# Patient Record
Sex: Female | Born: 1970 | Race: White | Hispanic: No | Marital: Single | Smoking: Former smoker
Health system: Southern US, Community
[De-identification: ages and names within clinical notes are randomized; demographics above are authoritative.]

## PROBLEM LIST (undated history)

## (undated) DIAGNOSIS — K219 Gastro-esophageal reflux disease without esophagitis: Secondary | ICD-10-CM

## (undated) DIAGNOSIS — F329 Major depressive disorder, single episode, unspecified: Secondary | ICD-10-CM

## (undated) DIAGNOSIS — G43909 Migraine, unspecified, not intractable, without status migrainosus: Secondary | ICD-10-CM

## (undated) DIAGNOSIS — F32A Depression, unspecified: Secondary | ICD-10-CM

## (undated) HISTORY — DX: Gastro-esophageal reflux disease without esophagitis: K21.9

## (undated) HISTORY — DX: Depression, unspecified: F32.A

## (undated) HISTORY — DX: Major depressive disorder, single episode, unspecified: F32.9

## (undated) HISTORY — DX: Migraine, unspecified, not intractable, without status migrainosus: G43.909

---

## 2001-04-27 HISTORY — PX: APPENDECTOMY: SHX54

## 2010-01-05 ENCOUNTER — Emergency Department (HOSPITAL_COMMUNITY): Admission: EM | Admit: 2010-01-05 | Discharge: 2010-01-05 | Payer: Self-pay | Admitting: Emergency Medicine

## 2011-10-23 IMAGING — CR DG FOOT COMPLETE 3+V*L*
3 series · 3 of 3 positions shown · non-contrast
Comparison: None.

CLINICAL DATA: Left foot pain secondary to a twisting injury on
01/04/2010.  Bruising to the top of the foot.

LEFT FOOT - COMPLETE 3+ VIEW

[t foot ap left]
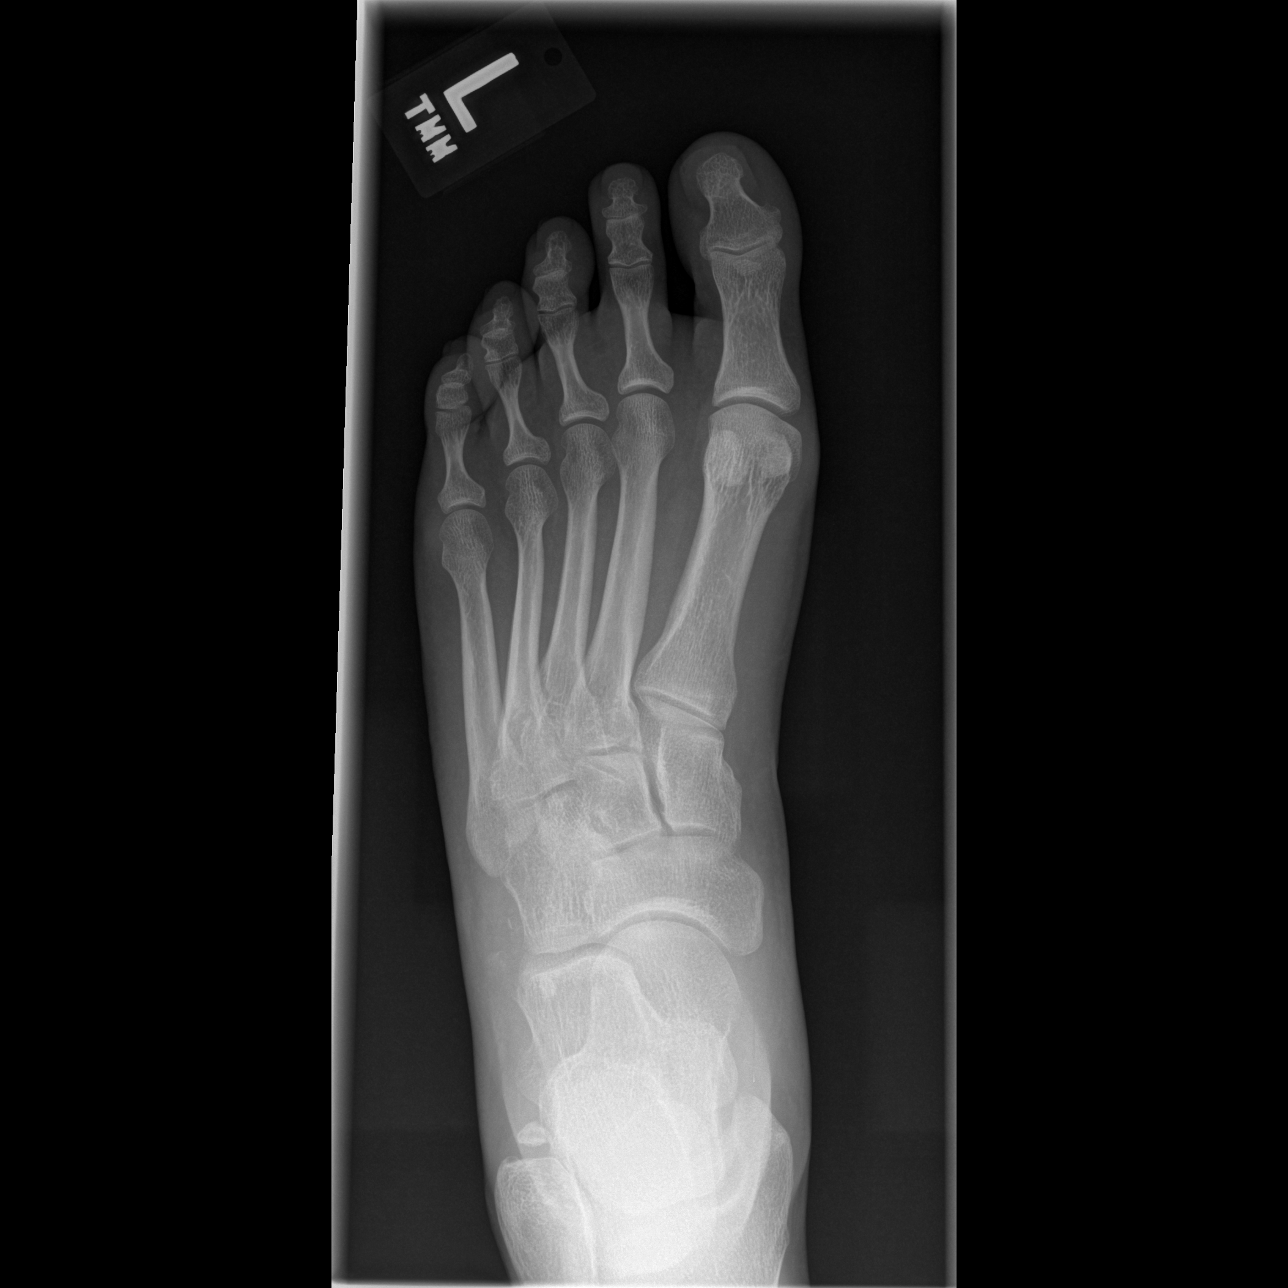

[t foot oblique left]
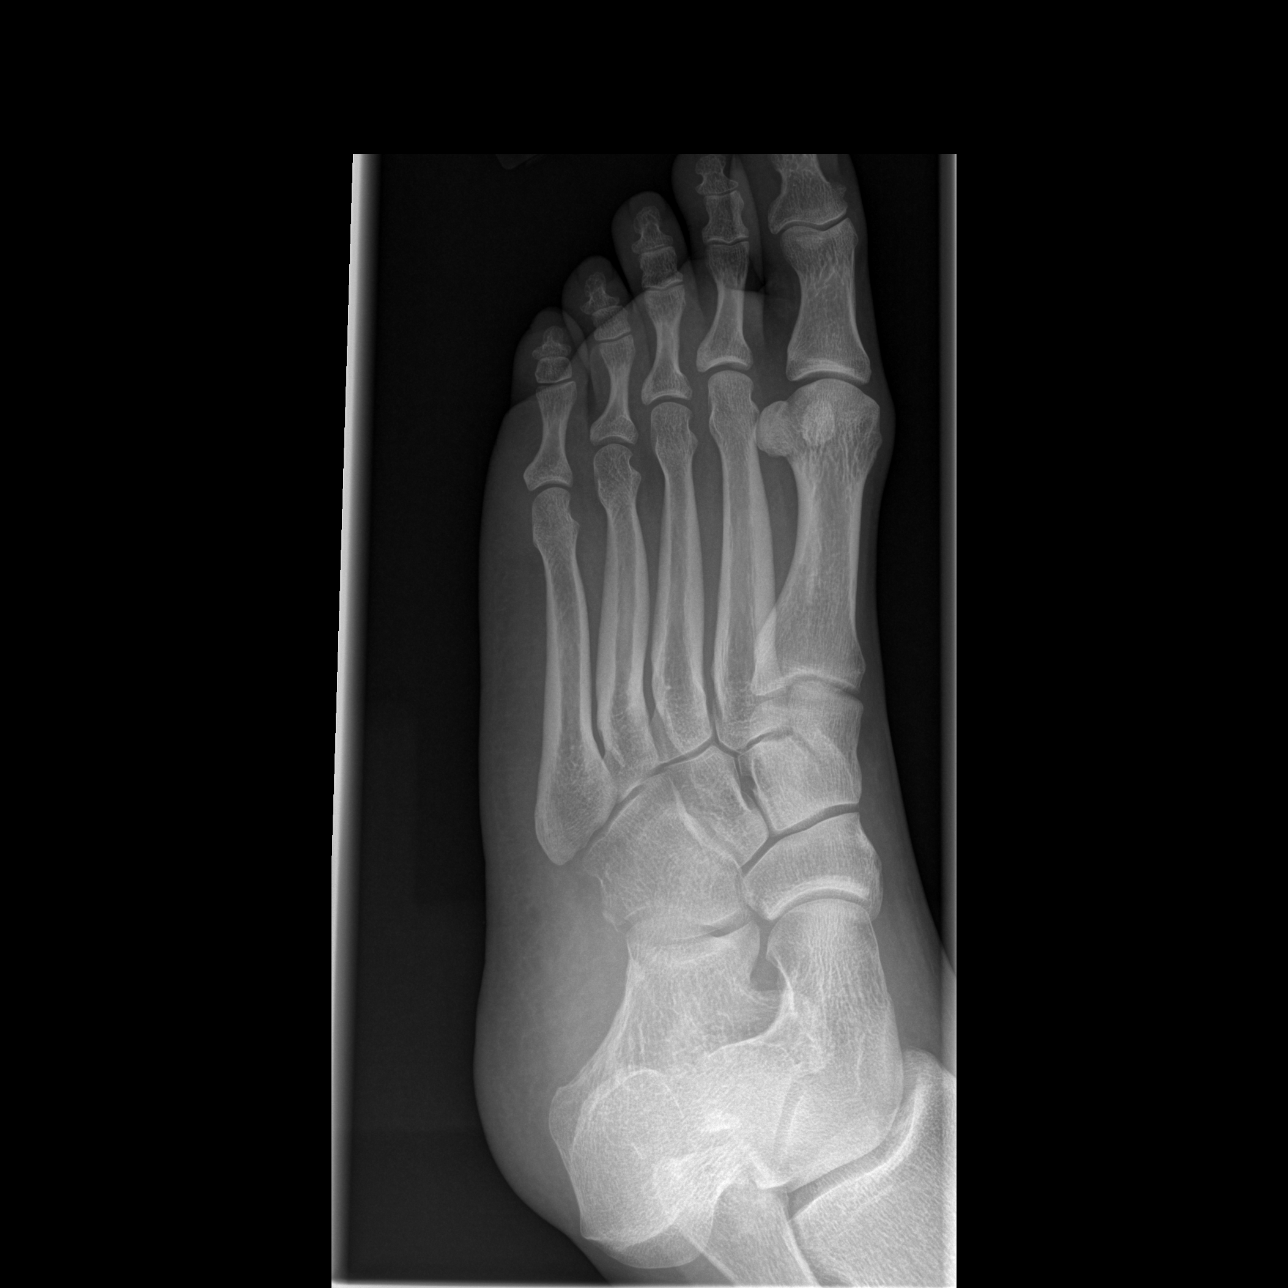

[t foot lat left]
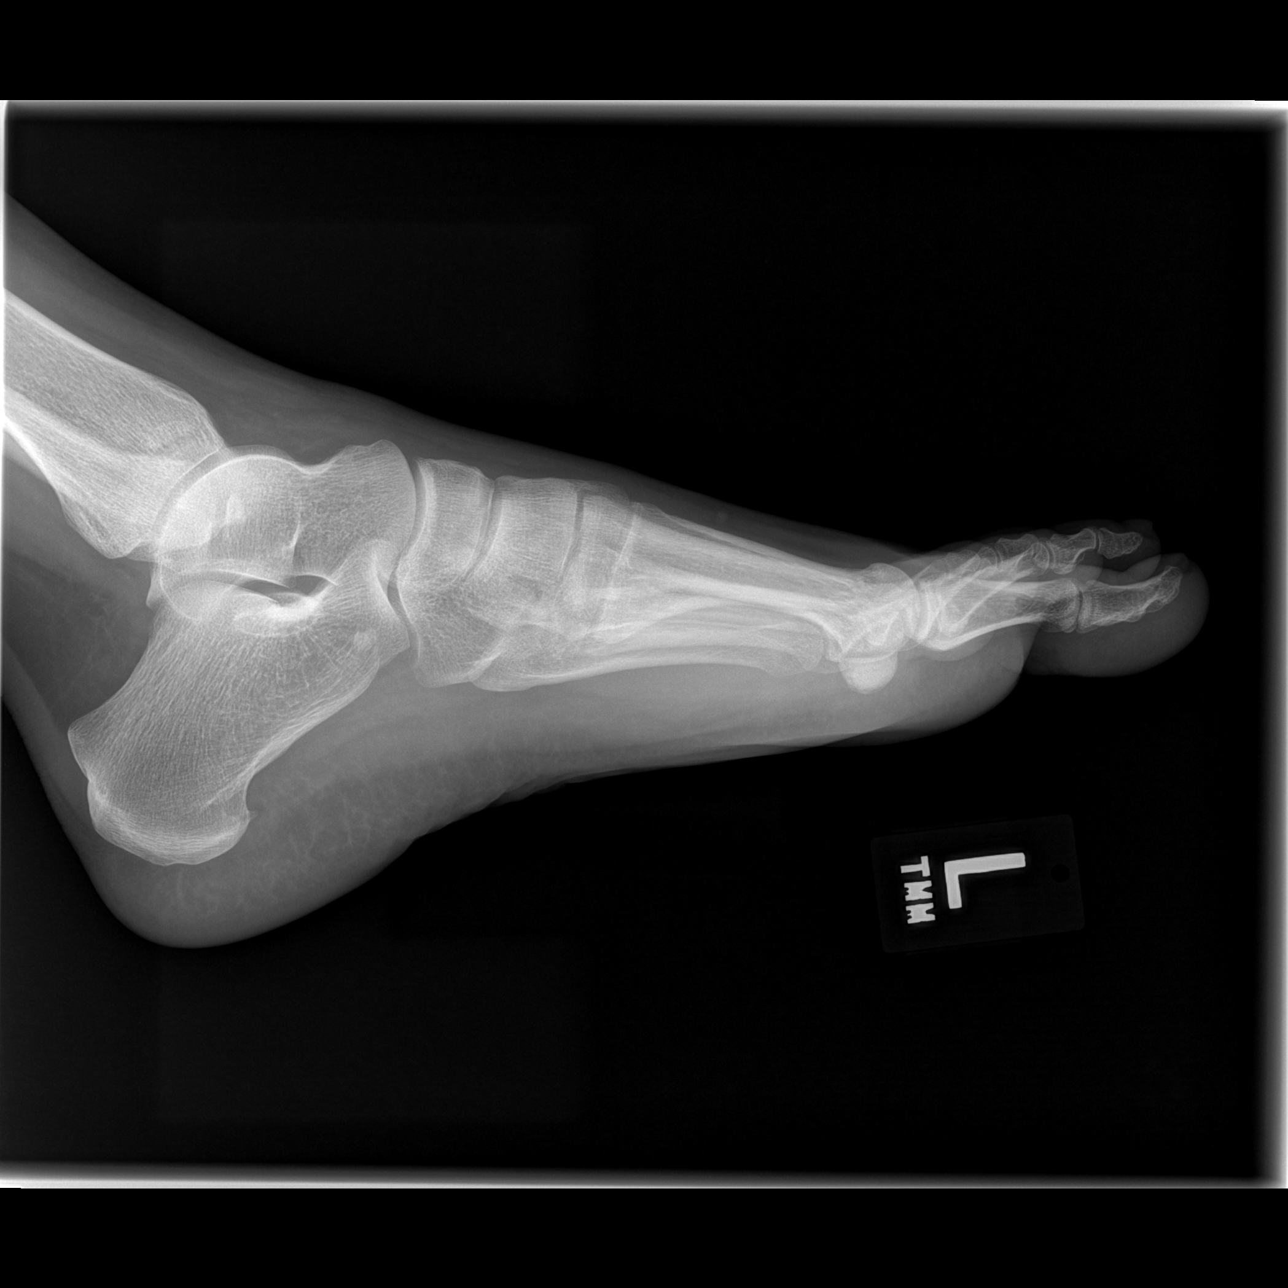

[3 of 3 positions shown; findings below may reference images not displayed]

FINDINGS: There is a small avulsion of bone from the distal lateral
aspect of the calcaneus toward the dorsum of the foot.   There is
an accessory ossification center of the tip of the fibula versus an
old avulsion.

There may be another small avulsion from the posterior lateral
aspect of the cuboid.
IMPRESSION: Small avulsions from the lateral aspect of the distal calcaneus and
possibly from the lateral aspect of the proximal cuboid.

## 2014-03-26 LAB — HM PAP SMEAR: HM Pap smear: POSITIVE

## 2014-05-03 LAB — HM MAMMOGRAPHY

## 2014-06-27 ENCOUNTER — Emergency Department: Payer: Self-pay | Admitting: Emergency Medicine

## 2014-07-03 ENCOUNTER — Emergency Department: Payer: Self-pay | Admitting: Emergency Medicine

## 2015-01-31 ENCOUNTER — Ambulatory Visit: Payer: Self-pay | Admitting: Internal Medicine

## 2015-02-05 ENCOUNTER — Ambulatory Visit (INDEPENDENT_AMBULATORY_CARE_PROVIDER_SITE_OTHER): Payer: No Typology Code available for payment source | Admitting: Internal Medicine

## 2015-02-05 ENCOUNTER — Encounter: Payer: Self-pay | Admitting: Internal Medicine

## 2015-02-05 VITALS — BP 118/84 | HR 74 | Temp 98.3°F | Ht 66.0 in | Wt 191.0 lb

## 2015-02-05 DIAGNOSIS — F32A Depression, unspecified: Secondary | ICD-10-CM

## 2015-02-05 DIAGNOSIS — R519 Headache, unspecified: Secondary | ICD-10-CM | POA: Insufficient documentation

## 2015-02-05 DIAGNOSIS — F329 Major depressive disorder, single episode, unspecified: Secondary | ICD-10-CM | POA: Diagnosis not present

## 2015-02-05 DIAGNOSIS — K219 Gastro-esophageal reflux disease without esophagitis: Secondary | ICD-10-CM | POA: Diagnosis not present

## 2015-02-05 DIAGNOSIS — R51 Headache: Secondary | ICD-10-CM | POA: Diagnosis not present

## 2015-02-05 DIAGNOSIS — Z23 Encounter for immunization: Secondary | ICD-10-CM

## 2015-02-05 DIAGNOSIS — G2581 Restless legs syndrome: Secondary | ICD-10-CM | POA: Insufficient documentation

## 2015-02-05 MED ORDER — PRAMIPEXOLE DIHYDROCHLORIDE 0.25 MG PO TABS: 0.2500 mg | ORAL_TABLET | Freq: Every day | ORAL | Status: DC

## 2015-02-05 MED ORDER — SERTRALINE HCL 100 MG PO TABS: 100.0000 mg | ORAL_TABLET | Freq: Every day | ORAL | Status: DC

## 2015-02-05 NOTE — Progress Notes (Signed)
Pre visit review using our clinic review tool, if applicable. No additional management support is needed unless otherwise documented below in the visit note. 

## 2015-02-05 NOTE — Assessment & Plan Note (Signed)
Chronic but stable on Zoloft Support offered today

## 2015-02-05 NOTE — Addendum Note (Signed)
Addended by: Roena Malady on: 02/05/2015 09:54 AM   Modules accepted: Orders

## 2015-02-05 NOTE — Assessment & Plan Note (Signed)
?   Tension Discussed how using Advil daily could be causing her GERD Discussed stress relieving techniques

## 2015-02-05 NOTE — Assessment & Plan Note (Signed)
Discussed how weight loss could help improve her reflux Discussed diet modification Advised her to continue Zantac as needed

## 2015-02-05 NOTE — Assessment & Plan Note (Signed)
Discussed treatment options eRx for Mirapex at [facility]  She will let me know via mychart how she is doing in 3 weeks

## 2015-02-05 NOTE — Patient Instructions (Signed)
Restless Legs Syndrome Restless legs syndrome is a condition that causes uncomfortable feelings or sensations in the legs, especially while sitting or lying down. The sensations usually cause an overwhelming urge to move the legs. The arms can also sometimes be affected. The condition can range from mild to severe. The symptoms often interfere with a person's ability to sleep. CAUSES The cause of this condition is not known. RISK FACTORS This condition is more likely to develop in:  People who are older than age 50.  Pregnant women. In general, restless legs syndrome is more common in women than in men.  People who have a family history of the condition.  People who have certain medical conditions, such as iron deficiency, kidney disease, Parkinson disease, or nerve damage.  People who take certain medicines, such as medicines for high blood pressure, nausea, colds, allergies, depression, and some heart conditions. SYMPTOMS The main symptom of this condition is uncomfortable sensations in the legs. These sensations may be:  Described as pulling, tingling, prickling, throbbing, crawling, or burning.  Worse while you are sitting or lying down.  Worse during periods of rest or inactivity.  Worse at night, often interfering with your sleep.  Accompanied by a very strong urge to move your legs.  Temporarily relieved by movement of your legs. The sensations usually affect both sides of the body. The arms can also be affected, but this is rare. People who have this condition often have tiredness during the day because of their lack of sleep at night. DIAGNOSIS This condition may be diagnosed based on your description of the symptoms. You may also have tests, including blood tests, to check for other conditions that may lead to your symptoms. In some cases, you may be asked to spend some time in a sleep lab so your sleeping can be monitored. TREATMENT Treatment for this condition is  focused on managing the symptoms. Treatment may include:  Self-help and lifestyle changes.  Medicines. HOME CARE INSTRUCTIONS  Take medicines only as directed by your health care provider.  Try these methods to get temporary relief from the uncomfortable sensations:  Massage your legs.  Walk or stretch.  Take a cold or hot bath.  Practice good sleep habits. For example, go to bed and get up at the same time every day.  Exercise regularly.  Practice ways of relaxing, such as yoga or meditation.  Avoid caffeine and alcohol.  Do not use any tobacco products, including cigarettes, chewing tobacco, or electronic cigarettes. If you need help quitting, ask your health care provider.  Keep all follow-up visits as directed by your health care provider. This is important. SEEK MEDICAL CARE IF: Your symptoms do not improve with treatment, or they get worse.   This information is not intended to replace advice given to you by your health care provider. Make sure you discuss any questions you have with your health care provider.   Document Released: 04/03/2002 Document Revised: 08/28/2014 Document Reviewed: 04/09/2014 Elsevier Interactive Patient Education 2016 Elsevier Inc.  

## 2015-02-05 NOTE — Progress Notes (Signed)
HPI  Pt presents to the clinic today to establish care and for management of the conditions listed below. She has not had a PCP in a while but she has been seeing GYN.  Depression: Chronic but stable on Zoloft. She denies anxiety, SI/HI.  GERD: She thinks it is related to weight gain. Occurs about 3 x week. Triggered by beer, and certain foods. She takes Zantac as needed with good relief.  Migraines: She reports she has a headaches every day. Located in the temples. She denies sensitivity to light and sound. She denies nausea or vomiting. She takes Advil as needed with good relief.  She is concerned about pain in her legs. This started about 1 month ago. It does seem worse at night. She describes the pain as a crawling sensation. She feels like she has to move her legs. It impairs her ability to fall asleep. It does not appear her ability to walk.  Flu: 01/2014 Tetanus: close to 10 years ago Pap Smear: 05/2014, abnormal, repeat pap this month Mammogram: 05/2014 Vision Screening: annually Dentist: annually  Past Medical History  Diagnosis Date  . Depression   . GERD (gastroesophageal reflux disease)   . Migraines     Current Outpatient Prescriptions  Medication Sig Dispense Refill  . ibuprofen (ADVIL,MOTRIN) 100 MG chewable tablet Chew 200 mg by mouth as needed.    . Multiple Vitamin (MULTIVITAMIN) tablet Take 1 tablet by mouth daily.    . ranitidine (ZANTAC) 150 MG tablet Take 150 mg by mouth as needed for heartburn.    . sertraline (ZOLOFT) 100 MG tablet Take 100 mg by mouth daily.     No current facility-administered medications for this visit.    No Known Allergies  Family History  Problem Relation Age of Onset  . Heart disease Mother   . Hypertension Mother   . Depression Mother   . Anxiety disorder Mother   . Stroke Sister   . Hypertension Sister   . Hyperlipidemia Maternal Grandmother   . Heart disease Maternal Grandmother   . Hypertension Maternal Grandmother   .  Hyperlipidemia Maternal Grandfather   . Heart disease Maternal Grandfather   . Hypertension Maternal Grandfather   . Depression Maternal Grandfather     Social History   Social History  . Marital Status: Single    Spouse Name: N/A  . Number of Children: N/A  . Years of Education: N/A   Occupational History  . Not on file.   Social History Main Topics  . Smoking status: Former Games developer  . Smokeless tobacco: Never Used     Comment: quit 2001  . Alcohol Use: 0.0 oz/week    0 Standard drinks or equivalent per week     Comment: social  . Drug Use: No  . Sexual Activity: Not on file   Other Topics Concern  . Not on file   Social History Narrative  . No narrative on file    ROS:  Constitutional: Denies fever, malaise, fatigue, headache or abrupt weight changes.  HEENT: Denies eye pain, eye redness, ear pain, ringing in the ears, wax buildup, runny nose, nasal congestion, bloody nose, or sore throat. Respiratory: Denies difficulty breathing, shortness of breath, cough or sputum production.   Cardiovascular: Denies chest pain, chest tightness, palpitations or swelling in the hands or feet.  Gastrointestinal: Pt reports reflux. Denies abdominal pain, bloating, constipation, diarrhea or blood in the stool.  GU: Denies frequency, urgency, pain with urination, blood in urine, odor or discharge.  Musculoskeletal: Pt reports pain in legs. Denies decrease in range of motion, difficulty with gait, muscle pain or joint pain and swelling.  Skin: Denies redness, rashes, lesions or ulcercations.  Neurological: Denies dizziness, difficulty with memory, difficulty with speech or problems with balance and coordination.  Psych: Pt reports history of depression. Denies anxiety, SI/HI.  No other specific complaints in a complete review of systems (except as listed in HPI above).  PE:  BP 118/84 mmHg  Pulse 74  Temp(Src) 98.3 F (36.8 C) (Oral)  Ht  (1.676 m)  Wt 191 lb (86.637 kg)   BMI 30.84 kg/m2  SpO2 98% Wt Readings from Last 3 Encounters:  02/05/15 191 lb (86.637 kg)    General: Appears her stated age, obese in NAD. Cardiovascular: Normal rate and rhythm. S1,S2 noted.  No murmur, rubs or gallops noted.  Pulmonary/Chest: Normal effort and positive vesicular breath sounds. No respiratory distress. No wheezes, rales or ronchi noted.  Abdomen: Soft and nontender. Normal bowel sounds.  Musculoskeletal: No calf swelling. No difficulty with gait.  Neurological: Alert and oriented. Sensation intact to BLE. Psychiatric: Mood and affect normal. Behavior is normal. Judgment and thought content normal.    Assessment and Plan:  Flu and Tdap today  Make an appt for your annual exam

## 2015-05-22 ENCOUNTER — Encounter: Payer: Self-pay | Admitting: Internal Medicine

## 2015-11-19 ENCOUNTER — Other Ambulatory Visit: Payer: Self-pay

## 2015-11-19 DIAGNOSIS — F329 Major depressive disorder, single episode, unspecified: Secondary | ICD-10-CM

## 2015-11-19 DIAGNOSIS — F32A Depression, unspecified: Secondary | ICD-10-CM

## 2015-11-19 NOTE — Telephone Encounter (Signed)
Pt left v/m requesting status of refill request submitted by [facility] [address]. Pt last seen 02/05/15. Last refilled #90 x 1 on 02/05/15.

## 2015-11-20 MED ORDER — SERTRALINE HCL 100 MG PO TABS: 100.0000 mg | ORAL_TABLET | Freq: Every day | ORAL | 1 refills | Status: DC

## 2015-11-20 NOTE — Telephone Encounter (Signed)
Sent electronically 

## 2015-12-23 ENCOUNTER — Encounter: Payer: Self-pay | Admitting: Internal Medicine

## 2015-12-23 ENCOUNTER — Ambulatory Visit (INDEPENDENT_AMBULATORY_CARE_PROVIDER_SITE_OTHER): Payer: Medicaid Other | Admitting: Internal Medicine

## 2015-12-23 VITALS — BP 120/82 | HR 68 | Temp 98.7°F | Wt 208.0 lb

## 2015-12-23 DIAGNOSIS — G2581 Restless legs syndrome: Secondary | ICD-10-CM

## 2015-12-23 DIAGNOSIS — R51 Headache: Secondary | ICD-10-CM

## 2015-12-23 DIAGNOSIS — L309 Dermatitis, unspecified: Secondary | ICD-10-CM

## 2015-12-23 DIAGNOSIS — F329 Major depressive disorder, single episode, unspecified: Secondary | ICD-10-CM

## 2015-12-23 DIAGNOSIS — R519 Headache, unspecified: Secondary | ICD-10-CM

## 2015-12-23 DIAGNOSIS — K219 Gastro-esophageal reflux disease without esophagitis: Secondary | ICD-10-CM | POA: Diagnosis not present

## 2015-12-23 DIAGNOSIS — F32A Depression, unspecified: Secondary | ICD-10-CM

## 2015-12-23 MED ORDER — SERTRALINE HCL 100 MG PO TABS: 100.0000 mg | ORAL_TABLET | Freq: Every day | ORAL | 3 refills | Status: AC

## 2015-12-23 MED ORDER — PRAMIPEXOLE DIHYDROCHLORIDE 0.25 MG PO TABS: 0.5000 mg | ORAL_TABLET | Freq: Every day | ORAL | 3 refills | Status: AC

## 2015-12-23 MED ORDER — ALIGN PO CAPS: 1.0000 | ORAL_CAPSULE | Freq: Every day | ORAL | 3 refills | Status: AC

## 2015-12-23 MED ORDER — TRIAMCINOLONE ACETONIDE 0.1 % EX CREA: 1.0000 "application " | TOPICAL_CREAM | Freq: Two times a day (BID) | CUTANEOUS | 0 refills | Status: AC

## 2015-12-23 MED ORDER — PRAMIPEXOLE DIHYDROCHLORIDE 0.25 MG PO TABS: 0.2500 mg | ORAL_TABLET | Freq: Every day | ORAL | 3 refills | Status: DC

## 2015-12-23 MED ORDER — PANTOPRAZOLE SODIUM 40 MG PO TBEC: 40.0000 mg | DELAYED_RELEASE_TABLET | Freq: Every day | ORAL | 3 refills | Status: AC

## 2015-12-23 NOTE — Progress Notes (Signed)
HPI  Pt presents to the clinic today for follow up of chronic conditions.  Depression: Chronic but stable on Zoloft. She denies anxiety, SI/HI.  GERD: She thinks it is related to weight gain. Occurs about 3 x week. Triggered by beer, and certain foods. She takes Zantac or Prilosec OTC and reports it is not helping as good as before.  Migraines: She reports she has a headaches every day. Located in the temples. She denies sensitivity to light and sound. She denies nausea or vomiting. She takes Advil as needed with good relief.  Restless legs: This started about 1 year ago. It does seem worse at night. She describes the pain as a crawling sensation. She feels like she has to move her legs. It impairs her ability to fall asleep. It does not appear her ability to walk. She is taking Mirapex as needed with good relief.   Past Medical History:  Diagnosis Date  . Depression   . GERD (gastroesophageal reflux disease)   . Migraines     Current Outpatient Prescriptions  Medication Sig Dispense Refill  . ibuprofen (ADVIL,MOTRIN) 100 MG chewable tablet Chew 200 mg by mouth as needed.    . Multiple Vitamin (MULTIVITAMIN) tablet Take 1 tablet by mouth daily.    . pramipexole (MIRAPEX) 0.25 MG tablet Take 1 tablet (0.25 mg total) by mouth at bedtime. 30 tablet 1  . ranitidine (ZANTAC) 150 MG tablet Take 150 mg by mouth as needed for heartburn.    . sertraline (ZOLOFT) 100 MG tablet Take 1 tablet (100 mg total) by mouth daily. 90 tablet 1   No current facility-administered medications for this visit.     No Known Allergies  Family History  Problem Relation Age of Onset  . Heart disease Mother   . Hypertension Mother   . Depression Mother   . Anxiety disorder Mother   . Stroke Sister   . Hypertension Sister   . Hyperlipidemia Maternal Grandmother   . Heart disease Maternal Grandmother   . Hypertension Maternal Grandmother   . Hyperlipidemia Maternal Grandfather   . Heart disease Maternal  Grandfather   . Hypertension Maternal Grandfather   . Depression Maternal Grandfather   . Cancer Neg Hx     Social History   Social History  . Marital status: Single    Spouse name: N/A  . Number of children: N/A  . Years of education: N/A   Occupational History  . Not on file.   Social History Main Topics  . Smoking status: Former Games developer  . Smokeless tobacco: Never Used     Comment: quit 2001  . Alcohol use 0.0 oz/week     Comment: social  . Drug use: No  . Sexual activity: Yes    Birth control/ protection: IUD   Other Topics Concern  . Not on file   Social History Narrative  . No narrative on file    ROS:  Constitutional: Denies fever, malaise, fatigue, headache or abrupt weight changes.  HEENT: Denies eye pain, eye redness, ear pain, ringing in the ears, wax buildup, runny nose, nasal congestion, bloody nose, or sore throat. Respiratory: Denies difficulty breathing, shortness of breath, cough or sputum production.   Cardiovascular: Denies chest pain, chest tightness, palpitations or swelling in the hands or feet.  Gastrointestinal: Pt reports reflux. Denies abdominal pain, bloating, constipation, diarrhea or blood in the stool.  GU: Denies frequency, urgency, pain with urination, blood in urine, odor or discharge. Musculoskeletal:  Denies decrease in range  of motion, difficulty with gait, muscle pain or joint pain and swelling.  Skin:  Pt reports rash on arms. Denies redness, lesions or ulcercations.  Neurological: Denies dizziness, difficulty with memory, difficulty with speech or problems with balance and coordination.  Psych: Pt reports history of depression. Denies anxiety, SI/HI.  No other specific complaints in a complete review of systems (except as listed in HPI above).  PE:  BP 120/82   Pulse 68   Temp 98.7 F (37.1 C) (Oral)   Wt 208 lb (94.3 kg)   SpO2 99%   BMI 33.57 kg/m   Wt Readings from Last 3 Encounters:  02/05/15 191 lb (86.6 kg)     General: Appears her stated age, obese in NAD. Skin: Maculopapular rash noted on bilateral antecubital fossas. Cardiovascular: Normal rate and rhythm. S1,S2 noted.  No murmur, rubs or gallops noted.  Pulmonary/Chest: Normal effort and positive vesicular breath sounds. No respiratory distress. No wheezes, rales or ronchi noted.  Abdomen: Soft and nontender. Hypoactive bowel sounds. No distention or masses noted. Musculoskeletal: No calf swelling. No difficulty with gait.  Neurological: Alert and oriented. Sensation intact to BLE. Psychiatric: Mood and affect normal. Behavior is normal. Judgment and thought content normal.    Assessment and Plan:  Eczema:  eRx for Triamcinolone cream BID to affected area Use a moisturizing lotion daily  RTC in 1 year for your annual exam

## 2015-12-23 NOTE — Assessment & Plan Note (Signed)
Try switching to Tylenol Ibuprofen may be contributing to worsening reflux Will monitor

## 2015-12-23 NOTE — Patient Instructions (Signed)
Food Choices for Gastroesophageal Reflux Disease, Adult When you have gastroesophageal reflux disease (GERD), the foods you eat and your eating habits are very important. Choosing the right foods can help ease the discomfort of GERD. WHAT GENERAL GUIDELINES DO I NEED TO FOLLOW?  Choose fruits, vegetables, whole grains, low-fat dairy products, and low-fat meat, fish, and poultry.  Limit fats such as oils, salad dressings, butter, nuts, and avocado.  Keep a food diary to identify foods that cause symptoms.  Avoid foods that cause reflux. These may be different for different people.  Eat frequent small meals instead of three large meals each day.  Eat your meals slowly, in a relaxed setting.  Limit fried foods.  Cook foods using methods other than frying.  Avoid drinking alcohol.  Avoid drinking large amounts of liquids with your meals.  Avoid bending over or lying down until 2-3 hours after eating. WHAT FOODS ARE NOT RECOMMENDED? The following are some foods and drinks that may worsen your symptoms: Vegetables Tomatoes. Tomato juice. Tomato and spaghetti sauce. Chili peppers. Onion and garlic. Horseradish. Fruits Oranges, grapefruit, and lemon (fruit and juice). Meats High-fat meats, fish, and poultry. This includes hot dogs, ribs, ham, sausage, salami, and bacon. Dairy Whole milk and chocolate milk. Sour cream. Cream. Butter. Ice cream. Cream cheese.  Beverages Coffee and tea, with or without caffeine. Carbonated beverages or energy drinks. Condiments Hot sauce. Barbecue sauce.  Sweets/Desserts Chocolate and cocoa. Donuts. Peppermint and spearmint. Fats and Oils High-fat foods, including French fries and potato chips. Other Vinegar. Strong spices, such as black pepper, white pepper, red pepper, cayenne, curry powder, cloves, ginger, and chili powder. The items listed above may not be a complete list of foods and beverages to avoid. Contact your dietitian for more  information.   This information is not intended to replace advice given to you by your health care provider. Make sure you discuss any questions you have with your health care provider.   Document Released: 04/13/2005 Document Revised: 05/04/2014 Document Reviewed: 02/15/2013 Elsevier Interactive Patient Education 2016 Elsevier Inc.  

## 2015-12-23 NOTE — Assessment & Plan Note (Signed)
Continue Mirapex prn

## 2015-12-23 NOTE — Assessment & Plan Note (Signed)
Deteriorated Encouraged weight loss and avoid foods that trigger your reflux Stop Prilosec eRx for Protonix 40 mg daily

## 2015-12-23 NOTE — Assessment & Plan Note (Signed)
Stable on Zoloft Refilled today 

## 2015-12-24 ENCOUNTER — Telehealth: Payer: Self-pay

## 2015-12-24 NOTE — Telephone Encounter (Signed)
Called pharmacy and Pharmacist stated that the Probiotic is not covered by insurance as it is available OTC-- I will call pt to let her know

## 2015-12-24 NOTE — Telephone Encounter (Signed)
It was sent and receipt was confirmed by pharmacy

## 2015-12-24 NOTE — Telephone Encounter (Signed)
Pt left v/m; pt was seen 12/23/15 and pt [facility] [address]. Pt wants to know if she is to have a prescription probiotic or should pt get probiotic OTC> pt request cb.

## 2016-04-13 IMAGING — CR DG CHEST 2V
1 series · 2 of 2 positions shown · non-contrast
Comparison: None.

CLINICAL DATA: Cough, fever for 6 days.

EXAM:
CHEST  2 VIEW

[Series 1: dxr chest pa (or ap) and lateral · 0.14mm/px · 2 of 2 slices shown]
[im 1/2]
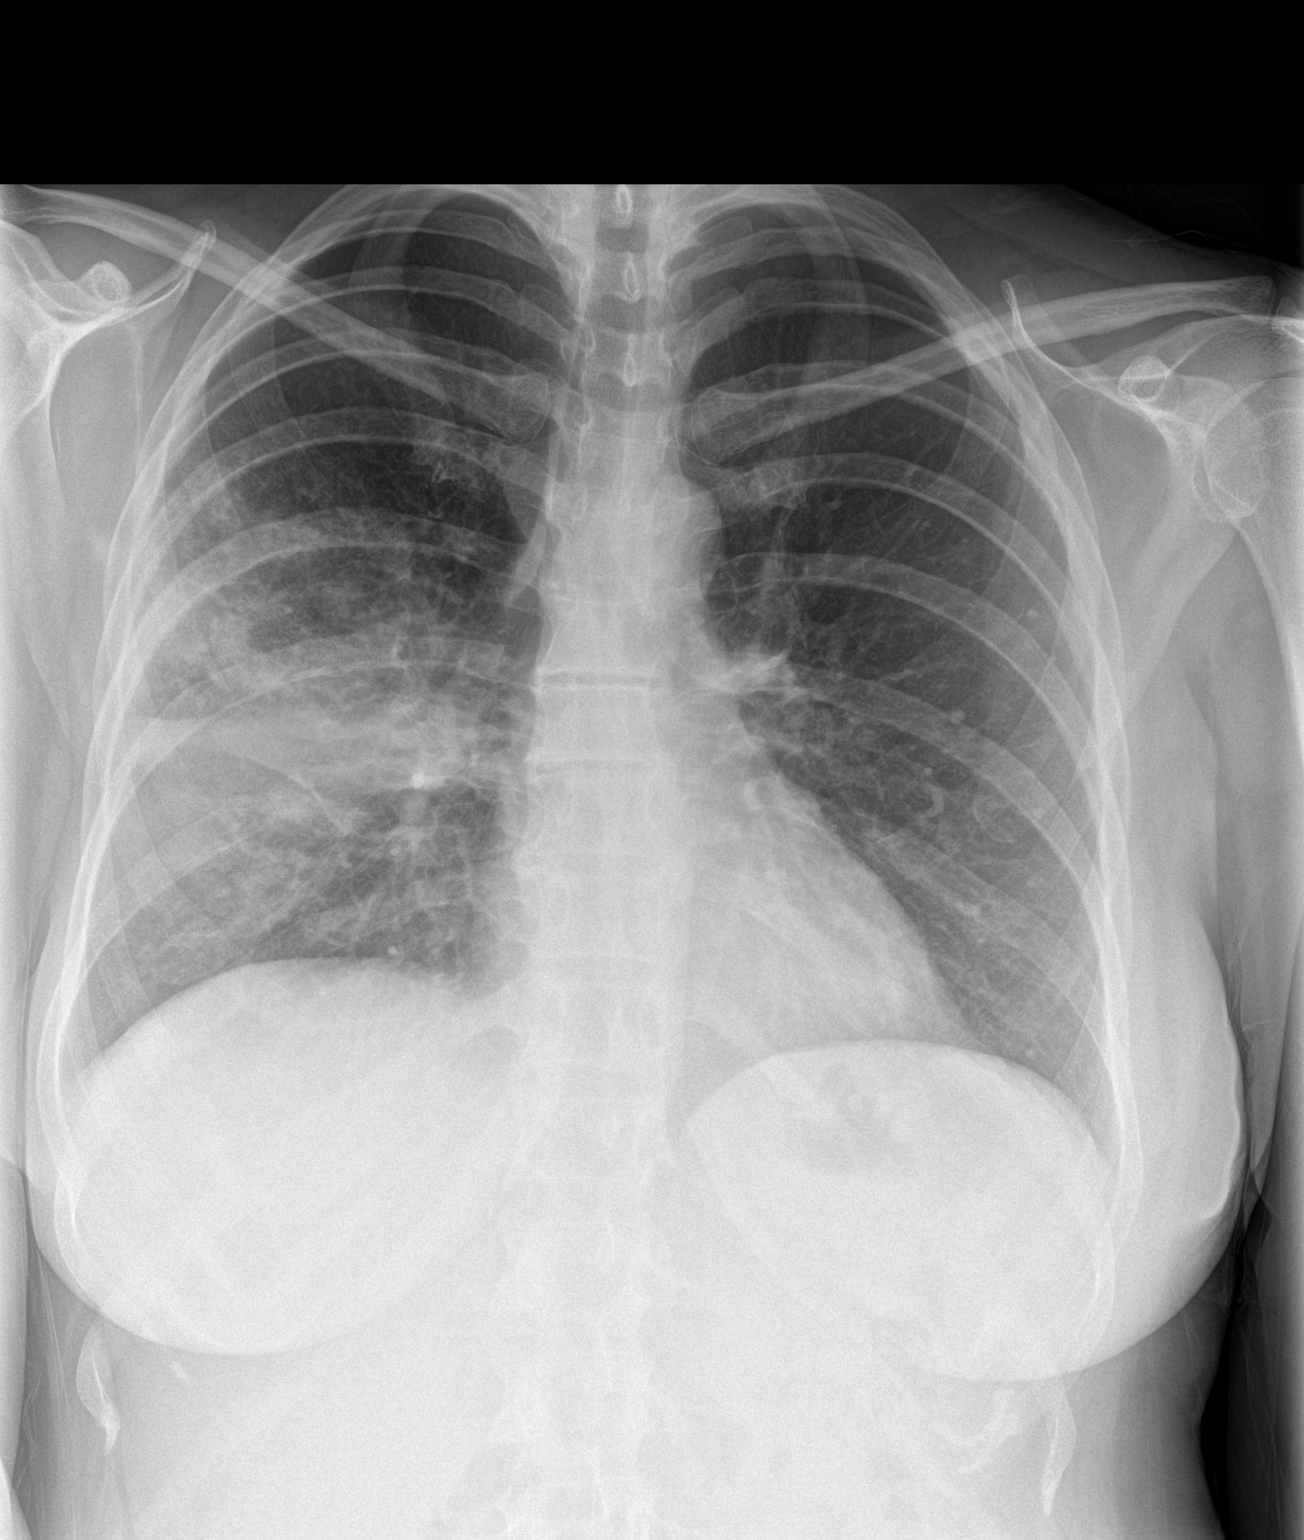
[im 2/2]
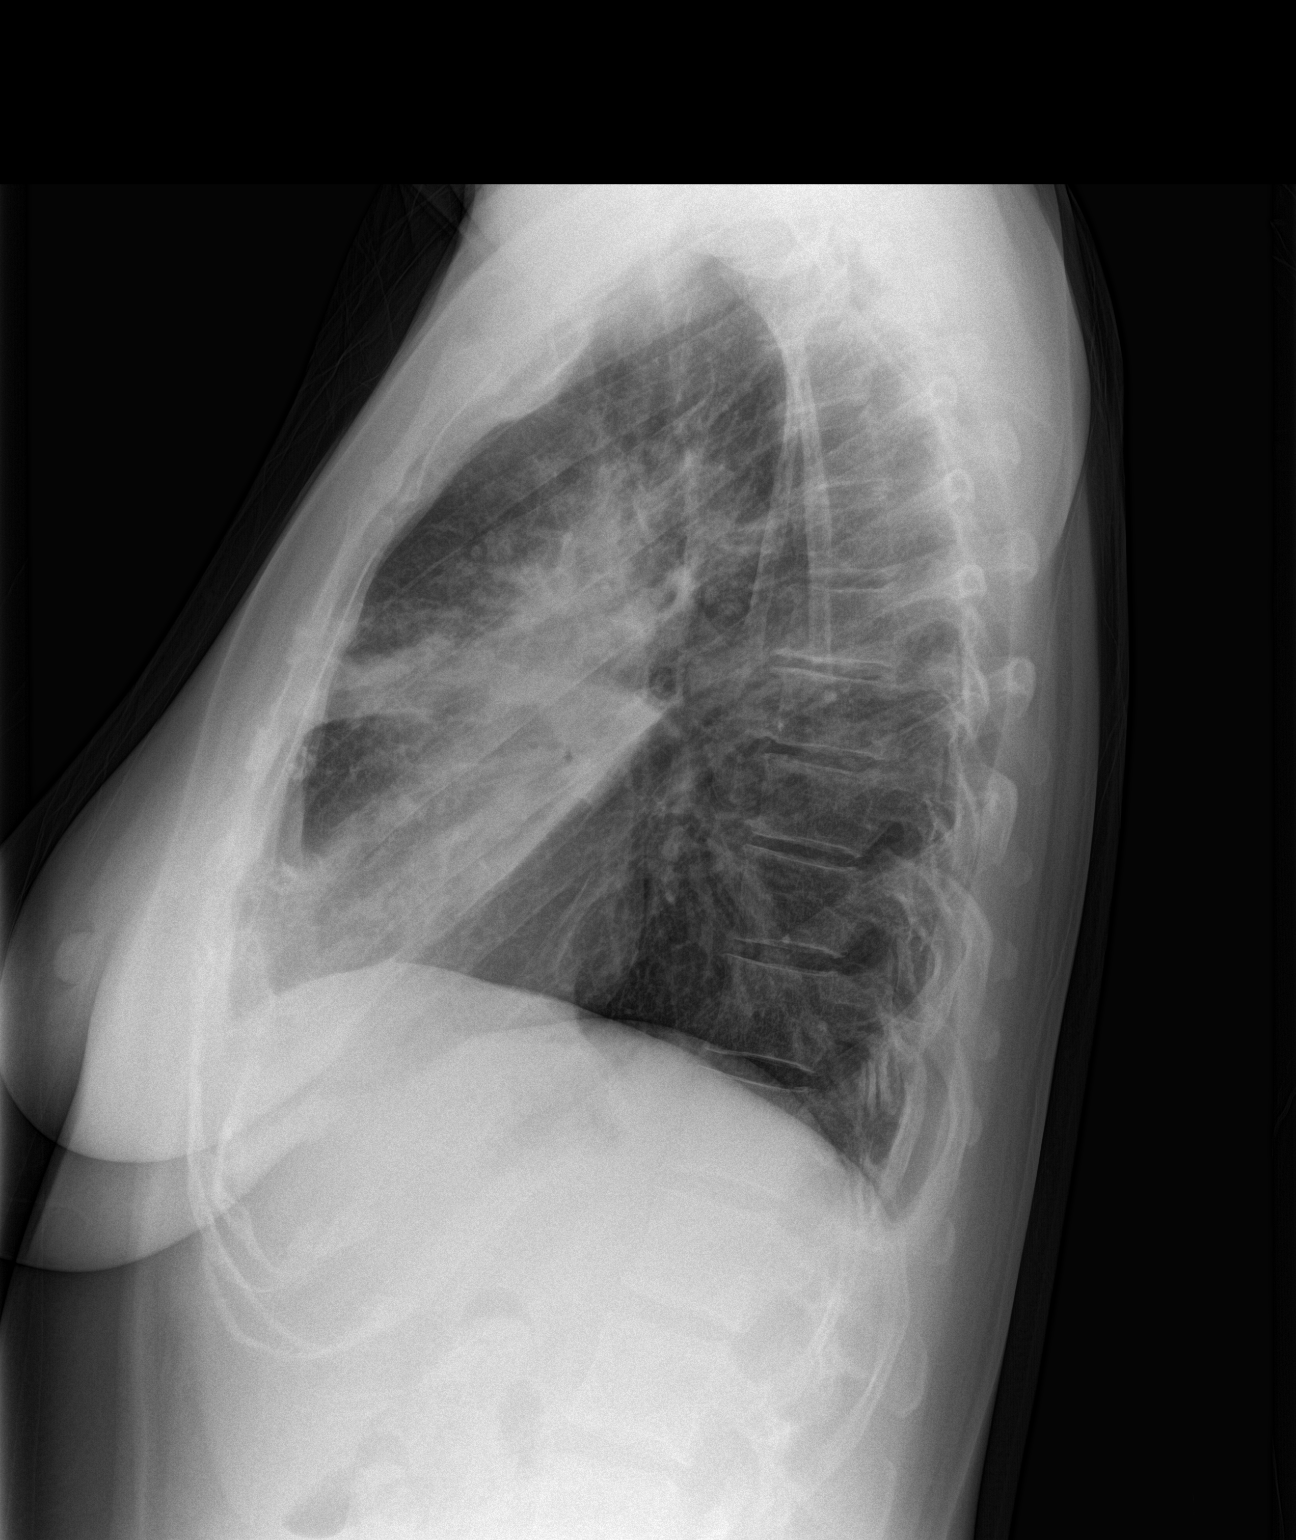

[2 of 2 positions shown; findings below may reference images not displayed]

FINDINGS: Consolidation noted in the anterior right upper lobe and right
middle lobe compatible with pneumonia. Left lung is clear. Heart is
normal size. No effusions. No acute bony abnormality.
IMPRESSION: Right upper lobe and middle lobe pneumonia.

## 2016-04-19 IMAGING — CR DG CHEST 2V
1 series · 2 of 2 positions shown · non-contrast
Comparison: 06/27/2014.

CLINICAL DATA: 44-year-old female with right pneumonia diagnosed
last week. Finished antibiotics and steroids, but symptoms persist
including weakness and shortness of Breath. Subsequent encounter.

EXAM:
CHEST  2 VIEW

[Series 1: dxr chest pa (or ap) and lateral · 0.14mm/px · 2 of 2 slices shown]
[im 1/2]
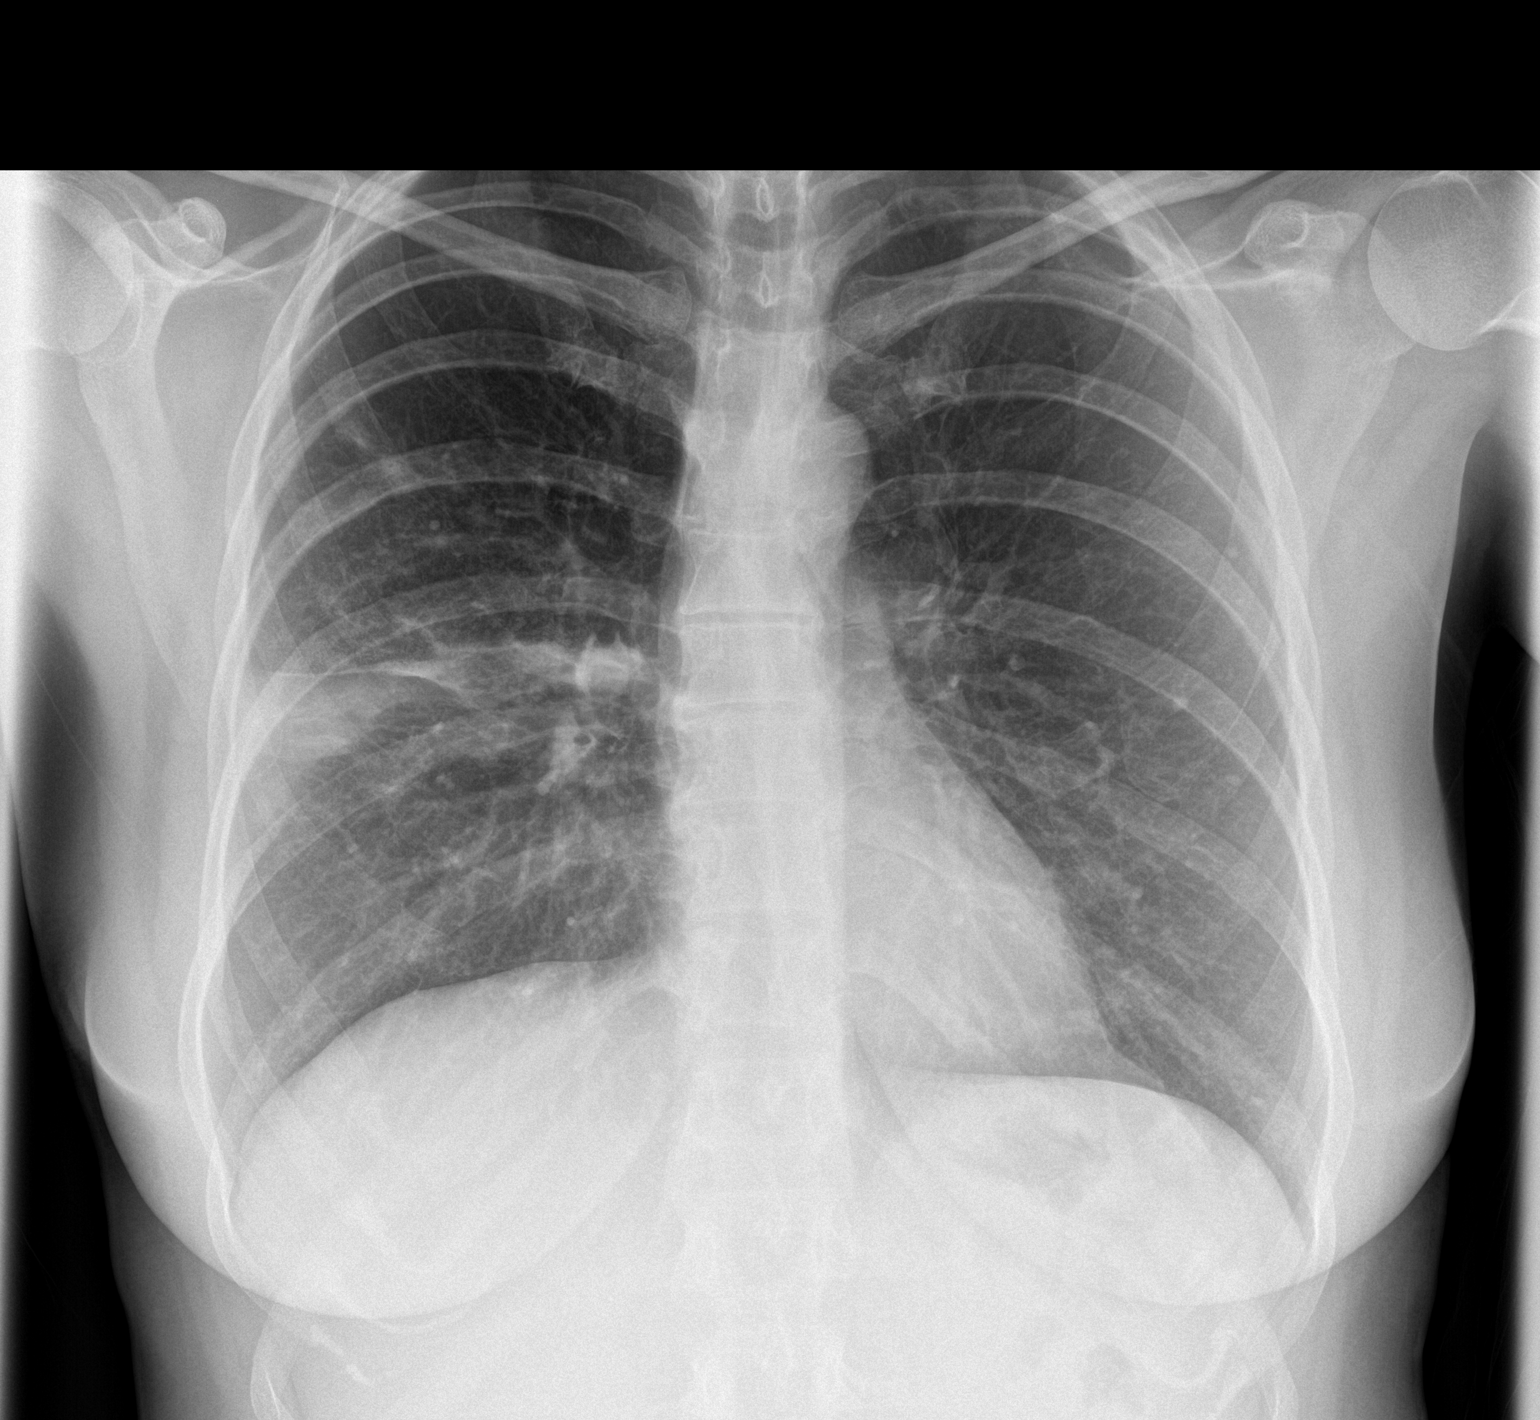
[im 2/2]
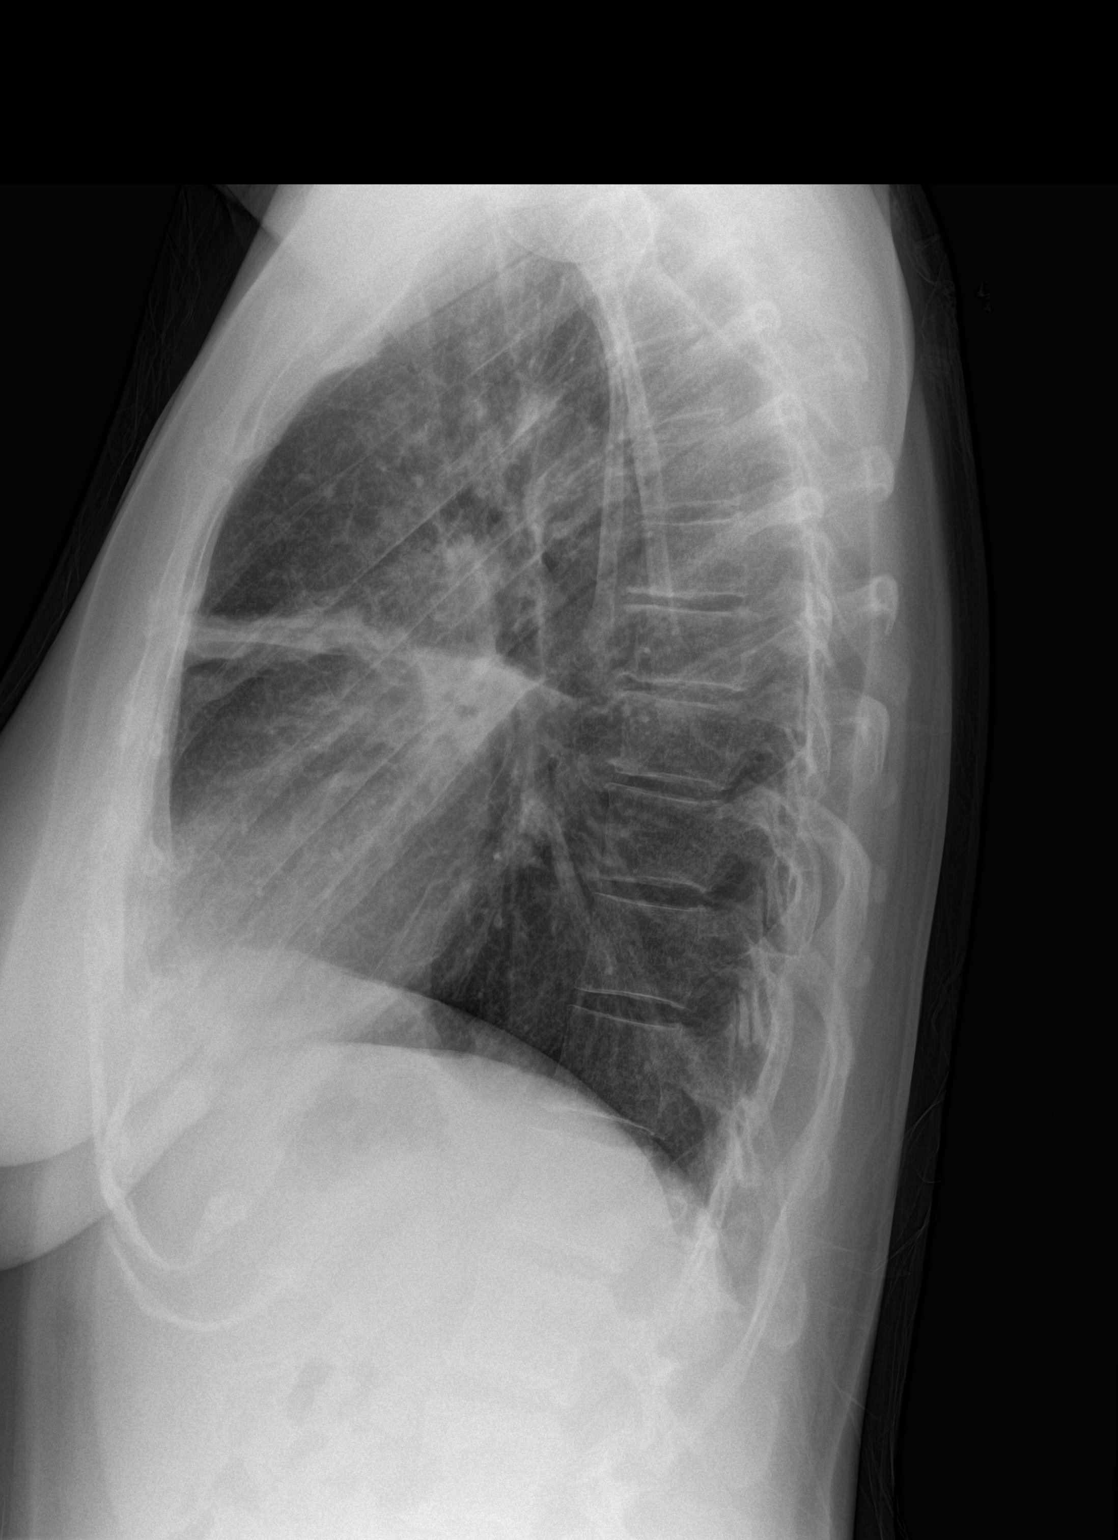

[2 of 2 positions shown; findings below may reference images not displayed]

FINDINGS: Decreased but not resolved airspace disease in the right middle and
upper lobes. The majority of the remaining opacity is in the right
middle lobe. Small right pleural effusion has resolved. No areas of
new opacity. Normal cardiac size and mediastinal contours.
Visualized tracheal air column is within normal limits. No
pneumothorax or pulmonary edema. Stable left lung. No acute osseous
abnormality identified.
IMPRESSION: Improved but not resolved multilobar right lung pneumonia. Small
associated right pleural effusion has resolved.

Recommend continued radiographic followup to complete resolution.

No new cardiopulmonary abnormality.

## 2022-04-27 DIAGNOSIS — Z419 Encounter for procedure for purposes other than remedying health state, unspecified: Secondary | ICD-10-CM | POA: Diagnosis not present

## 2022-05-28 DIAGNOSIS — Z419 Encounter for procedure for purposes other than remedying health state, unspecified: Secondary | ICD-10-CM | POA: Diagnosis not present

## 2022-06-25 ENCOUNTER — Ambulatory Visit: Payer: Medicaid Other | Admitting: Family Medicine

## 2022-06-26 DIAGNOSIS — Z419 Encounter for procedure for purposes other than remedying health state, unspecified: Secondary | ICD-10-CM | POA: Diagnosis not present

## 2022-07-27 DIAGNOSIS — Z419 Encounter for procedure for purposes other than remedying health state, unspecified: Secondary | ICD-10-CM | POA: Diagnosis not present

## 2022-08-26 DIAGNOSIS — Z419 Encounter for procedure for purposes other than remedying health state, unspecified: Secondary | ICD-10-CM | POA: Diagnosis not present

## 2022-09-26 DIAGNOSIS — Z419 Encounter for procedure for purposes other than remedying health state, unspecified: Secondary | ICD-10-CM | POA: Diagnosis not present

## 2022-10-26 DIAGNOSIS — Z419 Encounter for procedure for purposes other than remedying health state, unspecified: Secondary | ICD-10-CM | POA: Diagnosis not present

## 2022-11-26 DIAGNOSIS — Z419 Encounter for procedure for purposes other than remedying health state, unspecified: Secondary | ICD-10-CM | POA: Diagnosis not present

## 2022-12-27 DIAGNOSIS — Z419 Encounter for procedure for purposes other than remedying health state, unspecified: Secondary | ICD-10-CM | POA: Diagnosis not present

## 2023-01-26 DIAGNOSIS — Z419 Encounter for procedure for purposes other than remedying health state, unspecified: Secondary | ICD-10-CM | POA: Diagnosis not present

## 2023-02-26 DIAGNOSIS — Z419 Encounter for procedure for purposes other than remedying health state, unspecified: Secondary | ICD-10-CM | POA: Diagnosis not present
